# Patient Record
Sex: Male | Born: 2000 | Race: White | Hispanic: No | Marital: Single | Smoking: Never smoker
Health system: Southern US, Community
[De-identification: ages and names within clinical notes are randomized; demographics above are authoritative.]

## PROBLEM LIST (undated history)

## (undated) DIAGNOSIS — F32A Depression, unspecified: Secondary | ICD-10-CM

## (undated) HISTORY — PX: WRIST SURGERY: SHX841

---
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
## 2021-05-11 ENCOUNTER — Ambulatory Visit: Payer: 59 | Admitting: Sports Medicine

## 2021-05-11 VITALS — Ht 71.0 in | Wt 143.0 lb

## 2021-05-11 DIAGNOSIS — T148XXA Other injury of unspecified body region, initial encounter: Secondary | ICD-10-CM | POA: Diagnosis not present

## 2021-05-11 NOTE — Progress Notes (Signed)
   Subjective:    Patient ID: Christopher Rodgers, male    DOB: 2001/07/09, 20 y.o.   MRN: [id number]  HPI chief complaint: Right leg pain  Christopher Rodgers presents today complaining of right leg pain.  He initially injured this leg in April 2021.  He suffered a right quadriceps muscle tear and hematoma after kicking a soccer ball.  Over time, his swelling did resolve.  However, he endorses diffuse discomfort throughout the proximal and mid quad when initiating exercise.  He describes a discomfort initially at the start of a run that eventually subsides.  The other day he was skateboarding and noticed a little more discomfort than usual.  No recent trauma.  He does not endorse weakness.  No pain with activities of daily living.  He is here today with his mom.  Interim medical history reviewed Medications reviewed Allergies reviewed    Review of Systems As above    Objective:   Physical Exam  Well-developed, well-nourished.  No acute distress  Right leg: There is an obvious and palpable defect in the midportion of the right quadriceps muscle consistent with his previous quadriceps tear.  Full hip and knee range of motion.  Good strength.  No tenderness to palpation.  No soft tissue swelling.  Neurovascularly intact distally.      Assessment & Plan:   Chronic right quadriceps muscle tear  I explained to Mahesh that the defect in his quadriceps muscle will remain.  Other than some occasional soreness, it does not seem to interfere with his level of activity.  I explained to him and his mother that there is no good surgical solution for this and my advice would be to wear a thigh compression sleeve when exercising.  They understand.  Danzel will follow-up with me as needed.  This note was dictated using Dragon naturally speaking software and may contain errors in syntax, spelling, or content which have not been identified prior to signing this note.

## 2023-08-06 ENCOUNTER — Emergency Department (HOSPITAL_BASED_OUTPATIENT_CLINIC_OR_DEPARTMENT_OTHER)
Admission: EM | Admit: 2023-08-06 | Discharge: 2023-08-06 | Discharge status: Against Medical Advice | Payer: 59 | Attending: Emergency Medicine | Admitting: Emergency Medicine

## 2023-08-06 ENCOUNTER — Encounter (HOSPITAL_BASED_OUTPATIENT_CLINIC_OR_DEPARTMENT_OTHER): Payer: Self-pay

## 2023-08-06 ENCOUNTER — Other Ambulatory Visit: Payer: Self-pay

## 2023-08-06 DIAGNOSIS — R1031 Right lower quadrant pain: Secondary | ICD-10-CM | POA: Insufficient documentation

## 2023-08-06 DIAGNOSIS — Z5321 Procedure and treatment not carried out due to patient leaving prior to being seen by health care provider: Secondary | ICD-10-CM | POA: Diagnosis not present

## 2023-08-06 LAB — COMPREHENSIVE METABOLIC PANEL
ALT: 15 U/L (ref 0–44)
AST: 18 U/L (ref 15–41)
Albumin: 4.5 g/dL (ref 3.5–5.0)
Alkaline Phosphatase: 52 U/L (ref 38–126)
Anion gap: 10 (ref 5–15)
BUN: 12 mg/dL (ref 6–20)
CO2: 22 mmol/L (ref 22–32)
Calcium: 9 mg/dL (ref 8.9–10.3)
Chloride: 105 mmol/L (ref 98–111)
Creatinine, Ser: 0.87 mg/dL (ref 0.61–1.24)
GFR, Estimated: >60 mL/min (ref >60)
Glucose, Bld: 131 mg/dL — ABNORMAL HIGH (ref 70–99)
Potassium: 3.8 mmol/L (ref 3.5–5.1)
Sodium: 137 mmol/L (ref 135–145)
Total Bilirubin: 0.7 mg/dL (ref 0.0–1.2)
Total Protein: 7.2 g/dL (ref 6.5–8.1)

## 2023-08-06 LAB — URINALYSIS, MICROSCOPIC (REFLEX)

## 2023-08-06 LAB — URINALYSIS, ROUTINE W REFLEX MICROSCOPIC
Bilirubin Urine: NEGATIVE
Glucose, UA: NEGATIVE mg/dL
Ketones, ur: NEGATIVE mg/dL
Leukocytes,Ua: NEGATIVE
Nitrite: NEGATIVE
Protein, ur: NEGATIVE mg/dL
Specific Gravity, Urine: 1.02 (ref 1.005–1.030)
pH: 8.5 — ABNORMAL HIGH (ref 5.0–8.0)

## 2023-08-06 LAB — CBC
HCT: 40.3 % (ref 39.0–52.0)
Hemoglobin: 14.3 g/dL (ref 13.0–17.0)
MCH: 29 pg (ref 26.0–34.0)
MCHC: 35.5 g/dL (ref 30.0–36.0)
MCV: 81.7 fL (ref 80.0–100.0)
Platelets: 209 10*3/uL (ref 150–400)
RBC: 4.93 MIL/uL (ref 4.22–5.81)
RDW: 12.7 % (ref 11.5–15.5)
WBC: 6.8 10*3/uL (ref 4.0–10.5)
nRBC: 0 % (ref 0.0–0.2)

## 2023-08-06 LAB — LIPASE, BLOOD: Lipase: 28 U/L (ref 11–51)

## 2023-08-06 NOTE — ED Triage Notes (Signed)
 Pt arrives with right sided ABD pain that started this evening suddenly. Pt reports that pain also moves into his flank rigt flank area . Pt describes pain as sharp. Pt denies fevers or n/v.

## 2023-10-05 ENCOUNTER — Other Ambulatory Visit: Payer: Self-pay

## 2023-10-05 ENCOUNTER — Encounter (HOSPITAL_COMMUNITY): Payer: Self-pay

## 2023-10-05 ENCOUNTER — Emergency Department (HOSPITAL_COMMUNITY)
Admission: EM | Admit: 2023-10-05 | Discharge: 2023-10-05 | Disposition: A | Discharge status: Home / Self Care | Attending: Emergency Medicine | Admitting: Emergency Medicine

## 2023-10-05 DIAGNOSIS — F32A Depression, unspecified: Secondary | ICD-10-CM | POA: Insufficient documentation

## 2023-10-05 HISTORY — DX: Depression, unspecified: F32.A

## 2023-10-05 LAB — ACETAMINOPHEN LEVEL: Acetaminophen (Tylenol), Serum: <10 ug/mL — ABNORMAL LOW (ref 10–30)

## 2023-10-05 LAB — CBC
HCT: 45.5 % (ref 39.0–52.0)
Hemoglobin: 15.4 g/dL (ref 13.0–17.0)
MCH: 28.4 pg (ref 26.0–34.0)
MCHC: 33.8 g/dL (ref 30.0–36.0)
MCV: 83.9 fL (ref 80.0–100.0)
Platelets: 250 10*3/uL (ref 150–400)
RBC: 5.42 MIL/uL (ref 4.22–5.81)
RDW: 12.6 % (ref 11.5–15.5)
WBC: 7.3 10*3/uL (ref 4.0–10.5)
nRBC: 0 % (ref 0.0–0.2)

## 2023-10-05 LAB — COMPREHENSIVE METABOLIC PANEL
ALT: 27 U/L (ref 0–44)
AST: 21 U/L (ref 15–41)
Albumin: 3.8 g/dL (ref 3.5–5.0)
Alkaline Phosphatase: 49 U/L (ref 38–126)
Anion gap: 11 (ref 5–15)
BUN: 11 mg/dL (ref 6–20)
CO2: 25 mmol/L (ref 22–32)
Calcium: 9.5 mg/dL (ref 8.9–10.3)
Chloride: 103 mmol/L (ref 98–111)
Creatinine, Ser: 0.86 mg/dL (ref 0.61–1.24)
GFR, Estimated: >60 mL/min (ref >60)
Glucose, Bld: 104 mg/dL — ABNORMAL HIGH (ref 70–99)
Potassium: 4.3 mmol/L (ref 3.5–5.1)
Sodium: 139 mmol/L (ref 135–145)
Total Bilirubin: 0.7 mg/dL (ref 0.0–1.2)
Total Protein: 6.8 g/dL (ref 6.5–8.1)

## 2023-10-05 LAB — RAPID URINE DRUG SCREEN, HOSP PERFORMED
Amphetamines: NOT DETECTED
Barbiturates: NOT DETECTED
Benzodiazepines: NOT DETECTED
Cocaine: NOT DETECTED
Opiates: NOT DETECTED
Tetrahydrocannabinol: NOT DETECTED

## 2023-10-05 LAB — SALICYLATE LEVEL: Salicylate Lvl: <7 mg/dL — ABNORMAL LOW (ref 7.0–30.0)

## 2023-10-05 LAB — ETHANOL: Alcohol, Ethyl (B): <10 mg/dL (ref <10)

## 2023-10-05 NOTE — ED Provider Notes (Signed)
 [place] EMERGENCY DEPARTMENT AT [facility] Provider Note   CSN: [id number] Arrival date & time: 10/05/23  1328     History Chief Complaint  Patient presents with   Depression    HPI Christopher Rodgers is a 23 y.o. male presenting for psychiatric care.  He is a 23 year old male longstanding history depression.  Had to be switched off of his Lexapro which had been treating his symptoms well to BuSpar due to side effect profile. Unfortunately since he started the side effect profile.  He started having acute passive suicidal ideation.  No plan to hurt himself.  Told his mother he is having these feelings today. They recommend for immediate care and management.  He did have a 3-hour wait in the lobby and has not taken the medication this morning so his suicidal ideations have actually resolved during his weight today. He sitting or comfortably with his mother who provides collateral history and is in agreement that he is doing much better today. They are requesting discharge from the emergency room with a plan to go to a alternative location of treatment as they were placed in a hallway bed.   Patient's recorded medical, surgical, social, medication list and allergies were reviewed in the Snapshot window as part of the initial history.   Review of Systems   Review of Systems  Constitutional:  Negative for chills and fever.  HENT:  Negative for ear pain and sore throat.   Eyes:  Negative for pain and visual disturbance.  Respiratory:  Negative for cough and shortness of breath.   Cardiovascular:  Negative for chest pain and palpitations.  Gastrointestinal:  Negative for abdominal pain and vomiting.  Genitourinary:  Negative for dysuria and hematuria.  Musculoskeletal:  Negative for arthralgias and back pain.  Skin:  Negative for color change and rash.  Neurological:  Negative for seizures and syncope.  All other systems reviewed and are negative.   Physical Exam Updated  Vital Signs BP 126/82   Pulse 99   Temp 98.5 F (36.9 C)   Resp 18   Ht 5\' 11"  (1.803 m)   Wt 65.8 kg   SpO2 98%   BMI 20.22 kg/m  Physical Exam Vitals and nursing note reviewed.  Constitutional:      General: He is not in acute distress.    Appearance: He is well-developed.  HENT:     Head: Normocephalic and atraumatic.  Eyes:     Conjunctiva/sclera: Conjunctivae normal.  Cardiovascular:     Rate and Rhythm: Normal rate and regular rhythm.  Pulmonary:     Effort: Pulmonary effort is normal. No respiratory distress.  Abdominal:     General: Abdomen is flat. There is no distension.  Musculoskeletal:        General: No swelling or deformity.  Skin:    General: Skin is warm and dry.     Capillary Refill: Capillary refill takes less than 2 seconds.  Neurological:     Mental Status: He is alert and oriented to person, place, and time. Mental status is at baseline.      ED Course/ Medical Decision Making/ A&P    Procedures Procedures   Medications Ordered in ED Medications - No data to display  Medical Decision Making:   23 year old male presenting with a chief complaint of suicidal ideation.  He has no plan to hurt himself and is now recanted.  He is contracting verbally for safety with me. He has his mother at bedside  who is planning to take him directly to the behavioral health urgent care for more expedited psychiatric care. He denies any plan to hurt himself, has no history of self-harm attempts, no prior psychiatric admissions. Overall represents low risk for acute psychiatric emergency. Family will take him directly to the outside facility for further intervention. Disposition:  I have considered need for hospitalization, however, considering all of the above, I believe this patient is stable for discharge at this time.  Patient/family educated about specific return precautions for given chief complaint and symptoms.  Patient/family educated about follow-up  with PCP.     Patient/family expressed understanding of return precautions and need for follow-up. Patient spoken to regarding all imaging and laboratory results and appropriate follow up for these results. All education provided in verbal form with additional information in written form. Time was allowed for answering of patient questions. Patient discharged.    Emergency Department Medication Summary:   Medications - No data to display      Clinical Impression:  1. Depression, unspecified depression type      Discharge   Final Clinical Impression(s) / ED Diagnoses Final diagnoses:  Depression, unspecified depression type    Rx / DC Orders ED Discharge Orders     None         Glyn Ade, MD 10/05/23 1614

## 2023-10-05 NOTE — Discharge Instructions (Signed)
 Behavioral Health Resources in the [facility]  Intensive Outpatient Programs: [facility]      [address] [place], [place] [phone] Both a day and evening program       [facility] Outpatient     [address]        [place], [place] [address] [phone]         ADS: [facility] [address] [place] [phone]  [facility] Mental [facility] ACCESS LINE: [phone] or [phone] [address] [place], [place] [address] [email or web address]  Mobile Crisis Teams:                                        [facility]         Mobile Crisis Care Unit [phone]             [facility] [address] [place]ette Otto [phone]                                         [facility] [address] [place] [place] [phone]  Self-Help/Support Groups: [facility] [employer] of support groups [phone] (call for more info)  Narcotics Anonymous (NA) [facility] [address] [place] - 2 meetings at this location  Residential Treatment Programs:  [facility]      [address]        [place]       [phone]         [facility] [address] [place] [address] [place], [place]  [address] [phone]  [facility]  [address] [place], [place] [address] [phone] Admissions: 8am-3pm M-F  [facility]     [address]        [place], [place] [address]       [phone]         [facility] [address] [place], [place] [phone]  [facility] [address] [place], [place] [phone]  [facility] - Intensive Outpatient      [address]     [place], [place]       [phone]         [facility] ([facility]        [address] [place], [place] [phone] or [phone]  [facility] ([facility])  [address] [place], [place] [phone]  [facility] [address]                                               [address] [place] [phone]  [facility] Resources: [facility]478 [phone]               General Therapy                                                Angie Fava, PhD  [address] [place], [place] [address]         [phone]   Insurance  [facility]   [address] [place], [place] [address] [phone]  [facility] [address] [place], [place] [address] [phone] Insurance/Medicaid/sponsorship through [facility]                                              [address]                                        [place], [place] [address]    Therapy/tele-psych/case         [phone]          [facility] [address] [place], [place]  [address]  Adolescent/group home/case management [phone]                                           Creola Corn PhD       General therapy       Insurance   [phone]         Dr. Lolly Mustache Insurance [phone] M-F  [place] [facility] Medicaid, sponsorship [phone]

## 2023-10-05 NOTE — ED Triage Notes (Signed)
 Pt being treated for depression. Pt stopped lexapro approximately 2 weeks ago d/t side effects. Pt was then switched to buspar 15mg  BID. Pt started having dizziness and feeling like he was going to pass out. For past 2 days, pt has been more depressed, stating negative thoughts, verbalized wanting a car to hit him, states he would not commit suicide but has thoughts of not being here. Pt also diagnosed with mono 2 weeks ago and had prednisone dose pack for 12 days, last dose was 2 days ago. Pt was also given erythromycin x 5 days. Pt denies SI/HI/AVH at this time.

## 2023-11-04 ENCOUNTER — Encounter: Payer: Self-pay | Admitting: Behavioral Health

## 2023-11-04 ENCOUNTER — Ambulatory Visit: Admitting: Behavioral Health

## 2023-11-04 DIAGNOSIS — F411 Generalized anxiety disorder: Secondary | ICD-10-CM

## 2023-11-04 DIAGNOSIS — F330 Major depressive disorder, recurrent, mild: Secondary | ICD-10-CM

## 2023-11-04 NOTE — Progress Notes (Signed)
 [facility] MD/PA/NP Initial Note  11/04/2023 12:05 PM Christopher Rodgers  MRN:  [id number]  Chief Complaint:  Chief Complaint   Depression; Anxiety; Follow-up; Medication Refill; Patient Education     HPI:  "Ichael", 23 year old male presents to this office for initial visit and to establish care.  His mother Donnald Garre is present with his verbal consent.  Collateral information should be considered reliable.  York Spaniel says that he started to have some anxiety during the pandemic.  He says recently, "I overlooked things, I dwell on things and get fixated".  Says he had a few days over the last 2 weeks where he is felt depressed.  Says that he has experienced some passive SI over the last few months but says that he knows he would never act upon it.  Says that he loves his family too much and would never want to hurt them.  Says that he initially approached his PCP for the anxiety and depression and was started on a trial of Lexapro.  Says the medication was helpful but he did experience some blunting and sexual side effects that really bothered him.  Says that he then tried BuSpar but experienced increase in passive SI.  Says that he felt restlessness so he stopped the medication.  Felt that he was started on too high of a dose.  He would like to explore medication options and is also requesting GeneSight testing.  He reports anxiety today 5/10, and depression at 1/10.  He is sleeping 7 to 8 hours per night.  His PHQ 2 was negative.  His MDQ had 5 of the 14 criterion marked yes.  He does endorse frequent irritability, periods of talkativeness, racing thoughts, and increased energy.  He denies mania, no psychosis, no auditory or visual hallucinations.  Denies HI.  Patient states that he feels safe and verbally contracted for safety with this Clinical research associate.  Past psychiatric medication trials: Lexapro BuSpar     Visit Diagnosis:    ICD-10-CM   1. 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       Past Psychiatric History: Anxiety,  MDD  Past Medical History:  Past Medical History:  Diagnosis Date   Depression     Past Surgical History:  Procedure Laterality Date   WRIST SURGERY      Family Psychiatric History: see chart  Family History:  Family History  Problem Relation Age of Onset   Anxiety disorder Mother    Suicidality Maternal Aunt     Social History:  Social History   Socioeconomic History   Marital status: Single    Spouse name: Not on file   Number of children: Not on file   Years of education: 14   Highest education level: Some college, no degree  Occupational History   Occupation: St. Paediatric nurse  Tobacco Use   Smoking status: Never   Smokeless tobacco: Never  Vaping Use   Vaping status: Never Used  Substance and Sexual Activity   Alcohol use: Yes    Comment: light social   Drug use: No   Sexual activity: Yes  Other Topics Concern   Not on file  Social History Narrative   Lives at home with mom and dad. Enjoys guitar, sports free time.    Social Drivers of Corporate investment banker Strain: Low Risk  (09/25/2023)   Received from [facility]   Overall Financial Resource Strain (  CARDIA)    Difficulty of Paying Living Expenses: Not hard at all  Food Insecurity: No Food Insecurity (09/25/2023)   Received from [facility]   Hunger Vital Sign    Worried About Running Out of Food in the Last Year: Never true    Ran Out of Food in the Last Year: Never true  Transportation Needs: No Transportation Needs (09/25/2023)   Received from [facility] - Transportation    Lack of Transportation (Medical): No    Lack of Transportation (Non-Medical): No  Physical Activity: Insufficiently Active (09/17/2023)   Received from [facility]   Exercise Vital Sign    Days of Exercise per Week: 3 days    Minutes of Exercise per Session: 30 min  Stress: No Stress Concern Present (09/17/2023)    Received from [facility] - Occupational Stress Questionnaire    Feeling of Stress : Not at all  Social Connections: Socially Integrated (09/17/2023)   Received from [facility]   Social Network    How would you rate your social network (family, work, friends)?: Good participation with social networks    Allergies: No Known Allergies  Metabolic Disorder Labs: No results found for: "HGBA1C", "MPG" No results found for: "PROLACTIN" No results found for: "CHOL", "TRIG", "HDL", "CHOLHDL", "VLDL", "LDLCALC" No results found for: "TSH"  Therapeutic Level Labs: No results found for: "LITHIUM" No results found for: "VALPROATE" No results found for: "CBMZ"  Current Medications: Current Outpatient Medications  Medication Sig Dispense Refill   Doxycycline Hyclate 75 MG TABS      escitalopram (LEXAPRO) 10 MG tablet Take 10 mg by mouth daily.     No current facility-administered medications for this visit.    Medication Side Effects: none  Orders placed this visit:  No orders of the defined types were placed in this encounter.   Psychiatric Specialty Exam:  Review of Systems  Constitutional: Negative.   Allergic/Immunologic: Negative.   Psychiatric/Behavioral:  Positive for dysphoric mood. The patient is nervous/anxious.     Blood pressure 110/70, pulse 79, height 5\' 11"  (1.803 m), weight 144 lb (65.3 kg).Body mass index is 20.08 kg/m.  General Appearance: Casual, Neat, and Well Groomed  Eye Contact:  Good  Speech:  Clear and Coherent  Volume:  Normal  Mood:  Anxious  Affect:  Appropriate  Thought Process:  Coherent  Orientation:  Full (Time, Place, and Person)  Thought Content: Logical   Suicidal Thoughts:  No  Homicidal Thoughts:  No  Memory:  WNL  Judgement:  Good  Insight:  Good  Psychomotor Activity:  Normal  Concentration:  Concentration: Good  Recall:  Good  Fund of Knowledge: Good  Language: Good  Assets:   Desire for Improvement  ADL's:  Intact  Cognition: WNL  Prognosis:  Good   Screenings:  PHQ2-9    Flowsheet Row Office Visit from 11/04/2023 in [facility]  PHQ-2 Total Score 1      Flowsheet Row ED from 10/05/2023 in [facility] Department at [facility] ED from 08/06/2023 in [facility] Department at [facility]  C-SSRS RISK CATEGORY Low Risk No Risk       Receiving Psychotherapy: No   Treatment Plan/Recommendations:  Greater than 50% of  60 min face to face time with patient was spent on counseling and coordination of care. We discussed his history of anxiety and depression stemming back to 2020.  We  discussed his previous plan of care as well as past medication trials.  We reviewed possible medications and side effect profiles.  Discussed GeneSight testing with patient due to past side effects.  We agreed to complete GeneSight packet today and will follow-up with patient in 3 weeks to review and reassess.  We agreed today to: Will follow-up in 3 weeks to review GeneSight Will call or report worsening symptoms Provided emergency contact information Reviewed PDMP   Joan Flores, NP

## 2023-11-07 ENCOUNTER — Ambulatory Visit (HOSPITAL_COMMUNITY)
Admission: EM | Admit: 2023-11-07 | Discharge: 2023-11-08 | Disposition: A | Discharge status: Home / Self Care | Attending: Psychiatry | Admitting: Psychiatry

## 2023-11-07 ENCOUNTER — Telehealth: Payer: Self-pay | Admitting: Behavioral Health

## 2023-11-07 DIAGNOSIS — T43596A Underdosing of other antipsychotics and neuroleptics, initial encounter: Secondary | ICD-10-CM | POA: Insufficient documentation

## 2023-11-07 DIAGNOSIS — F930 Separation anxiety disorder of childhood: Secondary | ICD-10-CM | POA: Diagnosis not present

## 2023-11-07 DIAGNOSIS — R45851 Suicidal ideations: Secondary | ICD-10-CM | POA: Insufficient documentation

## 2023-11-07 DIAGNOSIS — F339 Major depressive disorder, recurrent, unspecified: Secondary | ICD-10-CM

## 2023-11-07 DIAGNOSIS — F338 Other recurrent depressive disorders: Secondary | ICD-10-CM | POA: Diagnosis not present

## 2023-11-07 DIAGNOSIS — F411 Generalized anxiety disorder: Secondary | ICD-10-CM | POA: Insufficient documentation

## 2023-11-07 DIAGNOSIS — Z91128 Patient's intentional underdosing of medication regimen for other reason: Secondary | ICD-10-CM | POA: Diagnosis not present

## 2023-11-07 DIAGNOSIS — T43226A Underdosing of selective serotonin reuptake inhibitors, initial encounter: Secondary | ICD-10-CM | POA: Diagnosis not present

## 2023-11-07 LAB — POCT URINE DRUG SCREEN - MANUAL ENTRY (I-SCREEN)
POC Amphetamine UR: NOT DETECTED
POC Buprenorphine (BUP): NOT DETECTED
POC Cocaine UR: NOT DETECTED
POC Marijuana UR: NOT DETECTED
POC Methadone UR: NOT DETECTED
POC Methamphetamine UR: NOT DETECTED
POC Morphine: NOT DETECTED
POC Oxazepam (BZO): NOT DETECTED
POC Oxycodone UR: NOT DETECTED
POC Secobarbital (BAR): NOT DETECTED

## 2023-11-07 MED ORDER — OLANZAPINE 5 MG PO TBDP: 5.0000 mg | ORAL_TABLET | Freq: Three times a day (TID) | ORAL | Status: DC | PRN

## 2023-11-07 MED ORDER — ACETAMINOPHEN 325 MG PO TABS: 650.0000 mg | ORAL_TABLET | Freq: Four times a day (QID) | ORAL | Status: DC | PRN

## 2023-11-07 MED ORDER — ALUM & MAG HYDROXIDE-SIMETH 200-200-20 MG/5ML PO SUSP: 30.0000 mL | ORAL | Status: DC | PRN

## 2023-11-07 MED ORDER — MAGNESIUM HYDROXIDE 400 MG/5ML PO SUSP: 30.0000 mL | Freq: Every day | ORAL | Status: DC | PRN

## 2023-11-07 MED ORDER — OLANZAPINE 10 MG IM SOLR: 5.0000 mg | Freq: Three times a day (TID) | INTRAMUSCULAR | Status: DC | PRN

## 2023-11-07 MED ORDER — OLANZAPINE 10 MG IM SOLR: 10.0000 mg | Freq: Three times a day (TID) | INTRAMUSCULAR | Status: DC | PRN

## 2023-11-07 NOTE — ED Provider Notes (Signed)
 [facility] Continuous Assessment Admission H&P  Date: 11/07/23 Patient Name: Christopher Rodgers MRN: [id number] Chief Complaint: increase anxiety with suicidal thoughts  Diagnoses:  Final diagnoses:  Suicidal ideation  Recurrent major depressive disorder, remission status unspecified (HCC)  GAD (generalized anxiety disorder)  Separation anxiety    HPI: Christopher Rodgers,  23 y/o male with a general anxiety, major depressive disorder.  Presented to [facility] accompanied by his mother.  Per the patient he has been having increased anxiety for the past 2 days and has been having suicidal thoughts.  Patient currently not seeing a psychiatrist or therapist, but patient stated he was started on Lexapro 10 mg by his PCP but he thought that it was too much and he was also started on BuSpar 30 mg.  Collateral patient's mother Christopher Rodgers), per the patient mother the patient told her today that he was feeling suicidal he wanted to kill himself and feeling that he wanted to die.  Patient mother stated that patient primary care physician had started patient on BuSpar but she thought that it was too much of a high dose and so she made an appointment with a psychiatrist because she would prefer to have somebody who specialize in depression to prescribe his medicine.  Mother also stated that patient did reach out to her psychiatrist and the psychiatrist nurse stated that he was unavailable today.  Writer discussed with both patient and mom that given patient suicidal thoughts he would need to be admitted for observation and reassess in the a.m.  Sometimes his mom became very emotional stating that they did not want to stay in that the patient wanted to sleep in his own bed.  Writer discussed with both patient and mom that because patient presented suicide thoughts and this has been going on from yesterday and also today we would need to admit the patient.   Face-to-face observation of patient, patient is alert and  oriented x 4, speech is clear, maintained minimal eye contact.  Throughout the whole interview patient is very dependent on his mom to talk for him.  Patient and mom does seem to have a codependent relationship.  Patient would sob and cry while talking and holding mom's hand while both of them would hug each other.  Patient endorsed suicidal thoughts but stated he did not have a plan.  Patient denies HI, AVH or paranoia.  Patient denies illicit drug use.  Patient endorsed drinking alcohol seldomly.  Patient denies access to guns.   Recommend observation unit      Total Time spent with patient: 1 hour  Musculoskeletal  Strength & Muscle Tone: within normal limits Gait & Station: normal Patient leans: N/A  Psychiatric Specialty Exam  Presentation General Appearance:  Casual  Eye Contact: Good  Speech: Clear and Coherent  Speech Volume: Normal  Handedness: Right   Mood and Affect  Mood: Anxious; Euthymic  Affect: Congruent   Thought Process  Thought Processes: Linear  Descriptions of Associations:Loose  Orientation:Full (Time, Place and Person)  Thought Content:Rumination; Scattered    Hallucinations:Hallucinations: None  Ideas of Reference:None  Suicidal Thoughts:Suicidal Thoughts: Yes, Passive SI Passive Intent and/or Plan: Without Plan  Homicidal Thoughts:Homicidal Thoughts: No   Sensorium  Memory: Immediate Fair  Judgment: Fair  Insight: Fair   Art therapist  Concentration: Fair  Attention Span: Good  Recall: Good  Fund of Knowledge: Good  Language: Good   Psychomotor Activity  Psychomotor Activity: Psychomotor Activity: Normal   Assets  Assets: Desire  for Improvement; Resilience   Sleep  Sleep: Sleep: Fair Number of Hours of Sleep: 6   Nutritional Assessment (For OBS and FBC admissions only) Has the patient had a weight loss or gain of 10 pounds or more in the last 3 months?: No Has the patient had a  decrease in food intake/or appetite?: No Does the patient have dental problems?: No Does the patient have eating habits or behaviors that may be indicators of an eating disorder including binging or inducing vomiting?: No Has the patient recently lost weight without trying?: 0 Has the patient been eating poorly because of a decreased appetite?: 0 Malnutrition Screening Tool Score: 0    Physical Exam HENT:     Head: Normocephalic.     Nose: Nose normal.  Eyes:     Pupils: Pupils are equal, round, and reactive to light.  Cardiovascular:     Rate and Rhythm: Normal rate.  Pulmonary:     Effort: Pulmonary effort is normal.  Musculoskeletal:        General: Normal range of motion.     Cervical back: Normal range of motion.  Neurological:     General: No focal deficit present.     Mental Status: He is alert.  Psychiatric:        Mood and Affect: Mood normal.        Behavior: Behavior normal.        Thought Content: Thought content normal.        Judgment: Judgment normal.    Review of Systems  Constitutional: Negative.   HENT: Negative.    Eyes: Negative.   Respiratory: Negative.    Cardiovascular: Negative.   Gastrointestinal: Negative.   Genitourinary: Negative.   Musculoskeletal: Negative.   Skin: Negative.   Neurological: Negative.   Psychiatric/Behavioral:  Positive for depression and suicidal ideas. The patient is nervous/anxious.     Blood pressure (!) 122/94, pulse 71, temperature (!) 97.5 F (36.4 C), temperature source Oral, resp. rate 16, SpO2 100%. There is no height or weight on file to calculate BMI.  Past Psychiatric History: GAD, MDD   Is the patient at risk to self? Yes  Has the patient been a risk to self in the past 6 months? Yes .    Has the patient been a risk to self within the distant past? No   Is the patient a risk to others? No   Has the patient been a risk to others in the past 6 months? No   Has the patient been a risk to others within the  distant past? No   Past Medical History: see chart   Family History: unknown   Social History: alcohol   Last Labs:  Admission on 10/05/2023, Discharged on 10/05/2023  Component Date Value Ref Range Status   Sodium 10/05/2023 139  135 - 145 mmol/L Final   Potassium 10/05/2023 4.3  3.5 - 5.1 mmol/L Final   Chloride 10/05/2023 103  98 - 111 mmol/L Final   CO2 10/05/2023 25  22 - 32 mmol/L Final   Glucose, Bld 10/05/2023 104 (H)  70 - 99 mg/dL Final   Glucose reference range applies only to samples taken after fasting for at least 8 hours.   BUN 10/05/2023 11  6 - 20 mg/dL Final   Creatinine, Ser 10/05/2023 0.86  0.61 - 1.24 mg/dL Final   Calcium 78/29/5621 9.5  8.9 - 10.3 mg/dL Final   Total Protein 30/86/5784 6.8  6.5 - 8.1 g/dL Final  Albumin 10/05/2023 3.8  3.5 - 5.0 g/dL Final   AST 16/05/9603 21  15 - 41 U/L Final   ALT 10/05/2023 27  0 - 44 U/L Final   Alkaline Phosphatase 10/05/2023 49  38 - 126 U/L Final   Total Bilirubin 10/05/2023 0.7  0.0 - 1.2 mg/dL Final   GFR, Estimated 10/05/2023 >60  >60 mL/min Final   Comment: (NOTE) Calculated using the CKD-EPI Creatinine Equation (2021)    Anion gap 10/05/2023 11  5 - 15 Final   Performed at [facility], [address], [place], [place] [address]   Alcohol, Ethyl (B) 10/05/2023 <10  <10 mg/dL Final   Comment: (NOTE) Lowest detectable limit for serum alcohol is 10 mg/dL.  For medical purposes only. Performed at [facility], [address], [place], [place] [address]    Salicylate Lvl 10/05/2023 <7.0 (L)  7.0 - 30.0 mg/dL Final   Performed at [facility], [address], [place], [place] [address]   Acetaminophen (Tylenol), Serum 10/05/2023 <10 (L)  10 - 30 ug/mL Final   Comment: (NOTE) Therapeutic concentrations vary significantly. A range of 10-30 ug/mL  may be an effective concentration for many patients. However, some  are best treated at concentrations outside of this range. Acetaminophen  concentrations >150 ug/mL at 4 hours after ingestion  and >50 ug/mL at 12 hours after ingestion are often associated with  toxic reactions.  Performed at [facility], [address], [place], [place] [address]    WBC 10/05/2023 7.3  4.0 - 10.5 K/uL Final   RBC 10/05/2023 5.42  4.22 - 5.81 MIL/uL Final   Hemoglobin 10/05/2023 15.4  13.0 - 17.0 g/dL Final   HCT 86/57/8469 45.5  39.0 - 52.0 % Final   MCV 10/05/2023 83.9  80.0 - 100.0 fL Final   MCH 10/05/2023 28.4  26.0 - 34.0 pg Final   MCHC 10/05/2023 33.8  30.0 - 36.0 g/dL Final   RDW 62/95/2841 12.6  11.5 - 15.5 % Final   Platelets 10/05/2023 250  150 - 400 K/uL Final   nRBC 10/05/2023 0.0  0.0 - 0.2 % Final   Performed at [facility], [address], [place], [place] [address]   Opiates 10/05/2023 NONE DETECTED  NONE DETECTED Final   Cocaine 10/05/2023 NONE DETECTED  NONE DETECTED Final   Benzodiazepines 10/05/2023 NONE DETECTED  NONE DETECTED Final   Amphetamines 10/05/2023 NONE DETECTED  NONE DETECTED Final   Tetrahydrocannabinol 10/05/2023 NONE DETECTED  NONE DETECTED Final   Barbiturates 10/05/2023 NONE DETECTED  NONE DETECTED Final   Comment: (NOTE) DRUG SCREEN FOR MEDICAL PURPOSES ONLY.  IF CONFIRMATION IS NEEDED FOR ANY PURPOSE, NOTIFY LAB WITHIN 5 DAYS.  LOWEST DETECTABLE LIMITS FOR URINE DRUG SCREEN Drug Class                     Cutoff (ng/mL) Amphetamine and metabolites    1000 Barbiturate and metabolites    200 Benzodiazepine                 200 Opiates and metabolites        300 Cocaine and metabolites        300 THC                            50 Performed at [facility], [address], [place], [place] [address]  Admission on 08/06/2023, Discharged on 08/06/2023  Component Date Value Ref Range Status   Lipase 08/06/2023 28  11 - 51 U/L Final   Performed at [facility], [address] [place], [place] [address]   Sodium 08/06/2023 137  135 - 145 mmol/L Final   Potassium  08/06/2023 3.8  3.5 - 5.1 mmol/L Final   Chloride 08/06/2023 105  98 - 111 mmol/L Final   CO2 08/06/2023 22  22 - 32 mmol/L Final   Glucose, Bld 08/06/2023 131 (H)  70 - 99 mg/dL Final   Glucose reference range applies only to samples taken after fasting for at least 8 hours.   BUN 08/06/2023 12  6 - 20 mg/dL Final   Creatinine, Ser 08/06/2023 0.87  0.61 - 1.24 mg/dL Final   Calcium 60/45/4098 9.0  8.9 - 10.3 mg/dL Final   Total Protein 11/91/4782 7.2  6.5 - 8.1 g/dL Final   Albumin 95/62/1308 4.5  3.5 - 5.0 g/dL Final   AST 65/78/4696 18  15 - 41 U/L Final   ALT 08/06/2023 15  0 - 44 U/L Final   Alkaline Phosphatase 08/06/2023 52  38 - 126 U/L Final   Total Bilirubin 08/06/2023 0.7  0.0 - 1.2 mg/dL Final   GFR, Estimated 08/06/2023 >60  >60 mL/min Final   Comment: (NOTE) Calculated using the CKD-EPI Creatinine Equation (2021)    Anion gap 08/06/2023 10  5 - 15 Final   Performed at [facility], [address] [place], [place] [address]   WBC 08/06/2023 6.8  4.0 - 10.5 K/uL Final   RBC 08/06/2023 4.93  4.22 - 5.81 MIL/uL Final   Hemoglobin 08/06/2023 14.3  13.0 - 17.0 g/dL Final   HCT 41/32/4401 40.3  39.0 - 52.0 % Final   MCV 08/06/2023 81.7  80.0 - 100.0 fL Final   MCH 08/06/2023 29.0  26.0 - 34.0 pg Final   MCHC 08/06/2023 35.5  30.0 - 36.0 g/dL Final   RDW 02/72/5366 12.7  11.5 - 15.5 % Final   Platelets 08/06/2023 209  150 - 400 K/uL Final   nRBC 08/06/2023 0.0  0.0 - 0.2 % Final   Performed at [facility], [address] [place], [place] [address]   Color, Urine 08/06/2023 YELLOW  YELLOW Final   APPearance 08/06/2023 CLOUDY (A)  CLEAR Final   Specific Gravity, Urine 08/06/2023 1.020  1.005 - 1.030 Final   pH 08/06/2023 8.5 (H)  5.0 - 8.0 Final   Glucose, UA 08/06/2023 NEGATIVE  NEGATIVE mg/dL Final   Hgb urine dipstick 08/06/2023 MODERATE (A)  NEGATIVE Final   Bilirubin Urine 08/06/2023 NEGATIVE  NEGATIVE Final   Ketones, ur 08/06/2023 NEGATIVE   NEGATIVE mg/dL Final   Protein, ur 74/25/9563 NEGATIVE  NEGATIVE mg/dL Final   Nitrite 87/56/4332 NEGATIVE  NEGATIVE Final   Leukocytes,Ua 08/06/2023 NEGATIVE  NEGATIVE Final   Performed at [facility], [address] [place], [place] [address]   RBC / HPF 08/06/2023 11-20  0 - 5 RBC/hpf Final   WBC, UA 08/06/2023 0-5  0 - 5 WBC/hpf Final   Bacteria, UA 08/06/2023 FEW (A)  NONE SEEN Final   Squamous Epithelial / HPF 08/06/2023 0-5  0 - 5 /HPF Final   Amorphous Crystal 08/06/2023 PRESENT   Final   Performed at [facility], [address] [place], [place] [address]    Allergies: Patient has no known  allergies.  Medications:  Facility Ordered Medications  Medication   acetaminophen (TYLENOL) tablet 650 mg   alum & mag hydroxide-simeth (MAALOX/MYLANTA) 200-200-20 MG/5ML suspension 30 mL   magnesium hydroxide (MILK OF MAGNESIA) suspension 30 mL   OLANZapine (ZYPREXA) injection 5 mg   OLANZapine (ZYPREXA) injection 10 mg   OLANZapine zydis (ZYPREXA) disintegrating tablet 5 mg   PTA Medications  Medication Sig   Doxycycline Hyclate 75 MG TABS    escitalopram (LEXAPRO) 10 MG tablet Take 10 mg by mouth daily.      Medical Decision Making  Observation unit    Recommendations  Based on my evaluation the patient does not appear to have an emergency medical condition.  Sindy Guadeloupe, NP 11/07/23  11:13 PM

## 2023-11-07 NOTE — Telephone Encounter (Signed)
 Mom has called to report patient having anxiety. He was seen this week for initial visit.  I called and tried to talk to pt, but mom is over talking in the background. Pt is rating his anxiety as 10/10 and his depression 8/10. States the anxiety is leading the depression. No new stressors. Minimal caffeine use. Asking for a medication for anxiety. Mom said he has had SSE from medications and wants to be sure that any medication would not cause similar issues.

## 2023-11-07 NOTE — Telephone Encounter (Signed)
 Mom called at 9:05a.  She states that pt had a panic attack yesterday.  He wants to try a medication that [name] mentioned.  Mom said [name] mentioned 3 different ones and she thinks it was the middle one and it started with a "V".  She said knows it wasn't Vyvanse because that's for ADHD. I said Venlafaxine, she said she thought so, but I guess you need to ask [name] for sure.  Pls send it to [facility].  Next appt 4/21

## 2023-11-07 NOTE — Progress Notes (Signed)
   11/07/23 1647  [facility] Triage Screening (Walk-ins at [facility] only)  What Is the Reason for Your Visit/Call Today? Pt presents to [facility] voluntarily accompanied by Mom. Pt states hes been experincng anxiety recently and Suicidal thoughts today and yesterday. "I know i wont act on it, but just having the thoughts is scary". "I feel like i have nobody but i do have people". Stopped taking anxiety meds a month ago and wonders if this is related. Pt states he is looking for medication to knock off the anxious thoughts. Pt denies SI, at this time, HI, AVH, Abuse, Alcohol/drug use.  How Long Has This Been Causing You Problems? <Week  Have You Recently Had Any Thoughts About Hurting Yourself? Yes  How long ago did you have thoughts about hurting yourself? Yesterday, no plan  Are You Planning to Commit Suicide/Harm Yourself At This time? No  Have you Recently Had Thoughts About Hurting Someone Karolee Ohs? No  Are You Planning To Harm Someone At This Time? No  Physical Abuse Denies  Verbal Abuse Denies  Sexual Abuse Denies  Exploitation of patient/patient's resources Denies  Self-Neglect Denies  Are you currently experiencing any auditory, visual or other hallucinations? No  Have You Used Any Alcohol or Drugs in the Past 24 Hours? No  Do you have any current medical co-morbidities that require immediate attention? No  What Do You Feel Would Help You the Most Today? Treatment for Depression or other mood problem;Medication(s)  If access to [facility] was not available, would you have sought care in the Emergency Department? No  Determination of Need Routine (7 days)  Options For Referral Outpatient Therapy;Medication Management

## 2023-11-07 NOTE — Telephone Encounter (Signed)
 I called mom again and told her that provider had an emergency and had to leave. I told him that another provider could not address the medication, but that a message had been sent to [place]. Mom/pt were at [facility].

## 2023-11-07 NOTE — ED Notes (Signed)
 Patient has been brought on unit, familiarized with unit and is now lying in the bed, will continue to monitor patient for safety.

## 2023-11-07 NOTE — Telephone Encounter (Deleted)
 Medication is NOT venlafaxine.

## 2023-11-07 NOTE — Telephone Encounter (Signed)
 Mom called back at 4:10 very concerned about Christopher Rodgers.  She is ready to take him to the [facility] care center.  She thought a medication was  going to be sent in.  I told her I was not sure when a medication would be sent in that is was being reviewed.  I told her if she do go to the [facility] unit and they prescribed something and then if we sent something in not to take both.  She should follow their instructions until they followed up with [name] for going forward.  If we send something in, please let her know.

## 2023-11-08 DIAGNOSIS — F411 Generalized anxiety disorder: Secondary | ICD-10-CM | POA: Diagnosis not present

## 2023-11-08 DIAGNOSIS — F930 Separation anxiety disorder of childhood: Secondary | ICD-10-CM | POA: Diagnosis not present

## 2023-11-08 DIAGNOSIS — F339 Major depressive disorder, recurrent, unspecified: Secondary | ICD-10-CM | POA: Diagnosis not present

## 2023-11-08 DIAGNOSIS — R45851 Suicidal ideations: Secondary | ICD-10-CM | POA: Diagnosis not present

## 2023-11-08 LAB — COMPREHENSIVE METABOLIC PANEL WITH GFR
ALT: 20 U/L (ref 0–44)
AST: 19 U/L (ref 15–41)
Albumin: 4.3 g/dL (ref 3.5–5.0)
Alkaline Phosphatase: 49 U/L (ref 38–126)
Anion gap: 11 (ref 5–15)
BUN: 14 mg/dL (ref 6–20)
CO2: 25 mmol/L (ref 22–32)
Calcium: 9.3 mg/dL (ref 8.9–10.3)
Chloride: 102 mmol/L (ref 98–111)
Creatinine, Ser: 0.86 mg/dL (ref 0.61–1.24)
GFR, Estimated: >60 mL/min (ref >60)
Glucose, Bld: 114 mg/dL — ABNORMAL HIGH (ref 70–99)
Potassium: 3.6 mmol/L (ref 3.5–5.1)
Sodium: 138 mmol/L (ref 135–145)
Total Bilirubin: 0.7 mg/dL (ref 0.0–1.2)
Total Protein: 7 g/dL (ref 6.5–8.1)

## 2023-11-08 LAB — CBC WITH DIFFERENTIAL/PLATELET
Abs Immature Granulocytes: 0.01 10*3/uL (ref 0.00–0.07)
Basophils Absolute: 0.1 10*3/uL (ref 0.0–0.1)
Basophils Relative: 1 %
Eosinophils Absolute: 0.9 10*3/uL — ABNORMAL HIGH (ref 0.0–0.5)
Eosinophils Relative: 11 %
HCT: 40.9 % (ref 39.0–52.0)
Hemoglobin: 14.6 g/dL (ref 13.0–17.0)
Immature Granulocytes: 0 %
Lymphocytes Relative: 24 %
Lymphs Abs: 1.8 10*3/uL (ref 0.7–4.0)
MCH: 29.4 pg (ref 26.0–34.0)
MCHC: 35.7 g/dL (ref 30.0–36.0)
MCV: 82.5 fL (ref 80.0–100.0)
Monocytes Absolute: 0.5 10*3/uL (ref 0.1–1.0)
Monocytes Relative: 7 %
Neutro Abs: 4.4 10*3/uL (ref 1.7–7.7)
Neutrophils Relative %: 57 %
Platelets: 174 10*3/uL (ref 150–400)
RBC: 4.96 MIL/uL (ref 4.22–5.81)
RDW: 13.5 % (ref 11.5–15.5)
WBC: 7.7 10*3/uL (ref 4.0–10.5)
nRBC: 0 % (ref 0.0–0.2)

## 2023-11-08 LAB — TSH: TSH: 1.027 u[IU]/mL (ref 0.350–4.500)

## 2023-11-08 LAB — ETHANOL: Alcohol, Ethyl (B): <10 mg/dL (ref <10)

## 2023-11-08 MED ORDER — HYDROXYZINE HCL 25 MG PO TABS: 25.0000 mg | ORAL_TABLET | Freq: Three times a day (TID) | ORAL | 0 refills | Status: DC | PRN

## 2023-11-08 MED ORDER — HYDROXYZINE HCL 25 MG PO TABS
25.0000 mg | ORAL_TABLET | Freq: Three times a day (TID) | ORAL | Status: DC | PRN
  Administered 2023-11-08: 25 mg via ORAL
  Filled 2023-11-08: qty 1

## 2023-11-08 MED ORDER — HYDROXYZINE HCL 25 MG PO TABS: 25.0000 mg | ORAL_TABLET | Freq: Three times a day (TID) | ORAL | 0 refills | Status: AC | PRN

## 2023-11-08 MED ORDER — VILAZODONE HCL 20 MG PO TABS: ORAL_TABLET | ORAL | 0 refills | Status: AC

## 2023-11-08 NOTE — ED Provider Notes (Signed)
 FBC/OBS ASAP Discharge Summary  Date and Time: 11/08/2023 8:55 AM  Name: Christopher Rodgers  MRN:  [id number]   Discharge Diagnoses:  Final diagnoses:  Suicidal ideation  Recurrent major depressive disorder, remission status unspecified (HCC)  GAD (generalized anxiety disorder)  Separation anxiety    Subjective: Christopher Rodgers 23 y.o., male patient presented to [facility] on 11/07/2023 as a voluntary walk in accompanied by his mother with complaints of increased anxiety and panic attacks leading to passive thoughts death without any plan to harm or kill self. He has PPH of depression and anxiety. Currently being followed by [facility].  Christopher Rodgers, is seen face to face by this provider, consulted with Dr. Loleta Chance; and chart reviewed on 11/08/23.  On evaluation Christopher Rodgers reports "I am feeling so much better. That anxiety medication that they gave me yesterday really helped. It is more so the anxiety and panic that leads to the thoughts of not wanting to be around because it feels so bad at that moment. I feel more anxious than I do depressed. The last time I thought about dying was yesterday morning. None today or since then though. I feel that the Buspar was making my anxiety worse so I am stopping that and will follow up with my doctor at [facility], I have an appointment sometime this week. I know I have a good support system with my mom and girlfriend. I also live with my mom. I have never actually tried to harm myself and I know wouldn't."  Pt is able to contract for safety. He does not have access to weapons or firearms in the home. He agrees to reach out mother or girlfriend for help if suicidal thoughts worsen or become active in nature or present to nearest ED/ [facility].   With patient's consent, writer called mother Christopher Rodgers [phone] for safety planning. She states that she spoke with pt this morning and feels that he is much better and his mood has improved along with  his anxiety decreased. She reports that pt does live with her and does not have access to weapons or firearms. She agrees to monitor him and encourage him to seek help if suicidal thoughts worsen or become active. She does  not have any safety concerns with pt being discharged home today. She is very grateful for care provided and will be able to come pick up pt this morning along with his girlfriend.   During evaluation Christopher Rodgers is lying in bed, in no acute distress.  He is alert & oriented x 4, calm, cooperative and attentive for this assessment.  His mood is euthymic with congruent affect.  He has normal speech, and behavior.  Objectively there is no evidence of psychosis/mania or delusional thinking. Pt does not appear to be responding to internal or external stimuli.  Patient is able to converse coherently, goal directed thoughts, no distractibility, or pre-occupation.  He also denies suicidal/self-harm/homicidal ideation, psychosis, and paranoia.  Patient answered assessment questions appropriately.    Stay Summary: 11/07/2023  Christopher Guadeloupe, NP                   Writer discussed with both patient and mom that given patient suicidal thoughts he would need to be admitted for observation and reassess in the a.m.  Sometimes his mom became very emotional stating that they did not want to stay in that the patient wanted to sleep in his own bed.  Writer discussed with both patient and mom that because patient presented suicide thoughts and this has been going on from yesterday and also today we would need to admit the patient. Face-to-face observation of patient, patient is alert and oriented x 4, speech is clear, maintained minimal eye contact.  Throughout the whole interview patient is very dependent on his mom to talk for him.  Patient and mom does seem to have a codependent relationship.  Patient would sob and cry while talking and holding mom's hand while both of them would hug each other.  Patient  endorsed suicidal thoughts but stated he did not have a plan.  Patient denies HI, AVH or paranoia.  Patient denies illicit drug use.  Patient endorsed drinking alcohol seldomly.  Patient denies access to guns. Recommend observation unit.  Total Time spent with patient: 30 minutes  Past Psychiatric History: MDD and GAD Past Medical History: Alopecia Family History: None reported Family Psychiatric History: None reported Social History: Pt is currently living with mother, he is employed and has a girlfriend that is supportive. He denies substance use, endorses occasional ETOH use.  Tobacco Cessation:  N/A, patient does not currently use tobacco products  Current Medications:  Current Facility-Administered Medications  Medication Dose Route Frequency Provider Last Rate Last Admin   acetaminophen (TYLENOL) tablet 650 mg  650 mg Oral Q6H PRN Christopher Guadeloupe, NP       alum & mag hydroxide-simeth (MAALOX/MYLANTA) 200-200-20 MG/5ML suspension 30 mL  30 mL Oral Q4H PRN Christopher Guadeloupe, NP       hydrOXYzine (ATARAX) tablet 25 mg  25 mg Oral TID PRN Onuoha, Chinwendu V, NP   25 mg at 11/08/23 0655   magnesium hydroxide (MILK OF MAGNESIA) suspension 30 mL  30 mL Oral Daily PRN Christopher Guadeloupe, NP       OLANZapine (ZYPREXA) injection 10 mg  10 mg Intramuscular TID PRN Christopher Guadeloupe, NP       OLANZapine (ZYPREXA) injection 5 mg  5 mg Intramuscular TID PRN Christopher Guadeloupe, NP       OLANZapine zydis (ZYPREXA) disintegrating tablet 5 mg  5 mg Oral TID PRN Christopher Guadeloupe, NP       Current Outpatient Medications  Medication Sig Dispense Refill   clobetasol (TEMOVATE) 0.05 % external solution Apply 1 Application topically daily.     ondansetron (ZOFRAN-ODT) 8 MG disintegrating tablet Take 8 mg by mouth 3 (three) times daily as needed for nausea or vomiting.     hydrOXYzine (ATARAX) 25 MG tablet Take 1 tablet (25 mg total) by mouth 3 (three) times daily as needed for anxiety. 21 tablet 0   minoxidil (LONITEN) 2.5 MG  tablet Take 2.5 mg by mouth daily.      PTA Medications:  Facility Ordered Medications  Medication   acetaminophen (TYLENOL) tablet 650 mg   alum & mag hydroxide-simeth (MAALOX/MYLANTA) 200-200-20 MG/5ML suspension 30 mL   magnesium hydroxide (MILK OF MAGNESIA) suspension 30 mL   OLANZapine (ZYPREXA) injection 5 mg   OLANZapine (ZYPREXA) injection 10 mg   OLANZapine zydis (ZYPREXA) disintegrating tablet 5 mg   hydrOXYzine (ATARAX) tablet 25 mg   PTA Medications  Medication Sig   ondansetron (ZOFRAN-ODT) 8 MG disintegrating tablet Take 8 mg by mouth 3 (three) times daily as needed for nausea or vomiting.   clobetasol (TEMOVATE) 0.05 % external solution Apply 1 Application topically daily.   minoxidil (LONITEN) 2.5 MG tablet Take 2.5 mg by mouth daily.   hydrOXYzine (ATARAX) 25 MG tablet  Take 1 tablet (25 mg total) by mouth 3 (three) times daily as needed for anxiety.       11/04/2023   11:45 AM 11/04/2023   11:44 AM  Depression screen PHQ 2/9  Decreased Interest 0 0  Down, Depressed, Hopeless 1 1  PHQ - 2 Score 1 1    Flowsheet Row ED from 11/07/2023 in [facility] ED from 10/05/2023 in [facility] Emergency Department at [facility] ED from 08/06/2023 in [facility] Emergency Department at [facility]  C-SSRS RISK CATEGORY Low Risk Low Risk No Risk       Musculoskeletal  Strength & Muscle Tone: within normal limits Gait & Station: normal Patient leans: N/A  Psychiatric Specialty Exam  Presentation  General Appearance:  Appropriate for Environment  Eye Contact: Good  Speech: Clear and Coherent  Speech Volume: Normal  Handedness: Right   Mood and Affect  Mood: Euthymic  Affect: Appropriate   Thought Process  Thought Processes: Coherent; Linear  Descriptions of Associations:Intact  Orientation:Full (Time, Place and Person)  Thought Content:WDL     Hallucinations:Hallucinations: None  Ideas of  Reference:None  Suicidal Thoughts:Suicidal Thoughts: No SI Passive Intent and/or Plan: Without Plan  Homicidal Thoughts:Homicidal Thoughts: No   Sensorium  Memory: Immediate Good; Recent Good  Judgment: Good  Insight: Good   Executive Functions  Concentration: Good  Attention Span: Good  Recall: Good  Fund of Knowledge: Good  Language: Good   Psychomotor Activity  Psychomotor Activity: Psychomotor Activity: Normal   Assets  Assets: Desire for Improvement; Housing; Physical Health; Social Support; Advertising copywriter; Health and safety inspector; Manufacturing systems engineer; Resilience   Sleep  Sleep: Sleep: Fair Number of Hours of Sleep: 6   Nutritional Assessment (For OBS and FBC admissions only) Has the patient had a weight loss or gain of 10 pounds or more in the last 3 months?: No Has the patient had a decrease in food intake/or appetite?: No Does the patient have dental problems?: No Does the patient have eating habits or behaviors that may be indicators of an eating disorder including binging or inducing vomiting?: No Has the patient recently lost weight without trying?: 0 Has the patient been eating poorly because of a decreased appetite?: 0 Malnutrition Screening Tool Score: 0    Physical Exam  Physical Exam Vitals and nursing note reviewed.  HENT:     Head: Normocephalic.     Nose: Nose normal.  Eyes:     Extraocular Movements: Extraocular movements intact.  Cardiovascular:     Rate and Rhythm: Normal rate.  Pulmonary:     Effort: Pulmonary effort is normal.  Musculoskeletal:        General: Normal range of motion.     Cervical back: Normal range of motion.  Neurological:     General: No focal deficit present.     Mental Status: He is alert and oriented to person, place, and time.    Review of Systems  Constitutional: Negative.   HENT: Negative.    Eyes: Negative.   Respiratory: Negative.    Cardiovascular: Negative.    Gastrointestinal: Negative.   Genitourinary: Negative.   Musculoskeletal: Negative.   Neurological: Negative.   Endo/Heme/Allergies: Negative.   Psychiatric/Behavioral:  Positive for depression. The patient is nervous/anxious.    Blood pressure (!) 143/87, pulse 62, temperature 98.3 F (36.8 C), temperature source Oral, resp. rate 17, SpO2 98%. There is no height or weight on file to calculate BMI.  Demographic Factors:  Male, Adolescent  or young adult, and Caucasian  Loss Factors: NA  Historical Factors: NA  Risk Reduction Factors:   Sense of responsibility to family, Employed, Living with another person, especially a relative, Positive social support, and Positive therapeutic relationship  Continued Clinical Symptoms:  Panic Attacks  Cognitive Features That Contribute To Risk:  None    Suicide Risk:  Minimal: No identifiable suicidal ideation.  Patients presenting with no risk factors but with morbid ruminations; may be classified as minimal risk based on the severity of the depressive symptoms  Plan Of Care/Follow-up recommendations:  Pt is being discharged home with mother. Safety planning was completed with both patient and mother (as charted above). Pt is able to contract for safety, denying suicidal ideations and mother does not have any immediate safety concerns with pt being discharged home today.  Safety Plan ANURAG SCARFO will reach out to Jamen Loiseau- his mother, call 911 or call mobile crisis, or go to nearest emergency room if condition worsens or if suicidal thoughts become active Patient will follow up with [facility] for outpatient psychiatric services (therapy/medication management).  The suicide prevention education provided includes the following: Suicide risk factors Suicide prevention and interventions National Suicide Hotline telephone number [facility] assessment telephone number [facility] Emergency  Assistance 911 [facility] and/or Residential Mobile Crisis Unit telephone number Request made of family/significant other to:   Remove weapons (e.g., guns, rifles, knives), all items previously/currently identified as safety concern.   Remove drugs/medications (over the counter, prescriptions, illicit drugs), all items previously/currently identified as a safety concern.   Pt has an outpatient follow up appointment with [facility] on 11/25/2023 to further discuss medication management. Pt is also encouraged to engage in therapy/ CBT to develop coping skills.   Pt is prescribed 1 week supply of Hydroxyzine 25 mg TID as needed for increased anxiety to bridge him through until his outpatient appointment. He is educated on the side effects and usage of this medication and verbalizes understanding.    While future psychiatric events cannot be accurately predicted, the patient does not currently require acute inpatient psychiatric care and does not currently meet [facility] involuntary commitment criteria. Patient was able to engage in safety planning including plan to return to [facility] or contact emergency services if he feels unable to maintain his own safety or the safety of others.     Disposition: Discharged home with mother and outpatient resources.   Howie Ill, NP 11/08/2023, 8:55 AM

## 2023-11-08 NOTE — ED Notes (Signed)
 Pt speaking w/ provider at this time

## 2023-11-08 NOTE — ED Notes (Signed)
 Pt sleeping at this time. Rise and fall of chest noted. Pt in NAD at this time. Will continue to monitor.

## 2023-11-08 NOTE — Telephone Encounter (Signed)
 The plan when he came in with mother was to do [place]. He wanted to wait until results came back in in three weeks to be reassessed and a medication discussed. Mother was present during that time. She needs to be reminded of that. Any medication that I right is not going to work overnight.

## 2023-11-08 NOTE — ED Notes (Addendum)
 Order has been placed for anxiety medication to help bring down patients bp. This nurse is waiting for Hydroxyzine to be verified by pharmacy

## 2023-11-08 NOTE — ED Notes (Signed)
 Pt is currently sleeping, no distress noted, environmental check complete, will continue to monitor patient for safety.

## 2023-11-08 NOTE — Telephone Encounter (Signed)
 Called mom. She is asking for [name] to prescribe a medication. I told her that she would need to see [name] to discuss [facility] results and she is asking for an appt ASAP. I'm waiting for [name] to let me know if it is ok to schedule an earlier appt.

## 2023-11-08 NOTE — Telephone Encounter (Signed)
 Scripts sent for Viibryd and hydroxyzine per Christopher Rodgers's request. Patient call with instructions on how to take and that he wanted to push FU out by 4 weeks. Have asked admin to call and change appt. Told him we would call him next week with therapist recommendations.

## 2023-11-08 NOTE — ED Notes (Signed)
 Patient discharged in no acute distress. He denies SI/HI and AVH at this time. Belongings returned from locker #13. Patient escorted to the front lobby for discharge home. Transported by his mother. Safety maintained.

## 2023-11-08 NOTE — Telephone Encounter (Signed)
 Christopher Rodgers prescribed Viibryd and hydroxyzine. He wants to schedule FU 4 weeks out to see how the medication is working. He needs therapy ASAP. Will talk with Christopher Rodgers about referring out for the needed therapy.

## 2023-11-08 NOTE — ED Notes (Signed)
 NP made aware via secure chat of patients elevated bp

## 2023-11-08 NOTE — Discharge Instructions (Addendum)
 Discharge recommendations:   Medications: Start taking Hydroxyzine 25 mg as needed for increased anxiety or panic, may take up to three times a day if needed. Patient is to take medications as prescribed. The patient is to contact a medical professional and/or outpatient provider to address any new side effects that develop. The patient should update outpatient providers of any new medications and/or medication changes.    Outpatient Follow up: Please follow up with your outpatient mental health appointment with [facility] on 11/25/2023 at 1:30 pm. Please follow up with your primary care provider for all medical related needs.    Therapy: We recommend that patient participate in individual therapy to address mental health concerns.   Safety:   The following safety precautions should be taken:   No sharp objects. This includes scissors, razors, scrapers, and putty knives.   Chemicals should be removed and locked up.   Medications should be removed and locked up.   Weapons should be removed and locked up. This includes firearms, knives and instruments that can be used to cause injury.   The patient should abstain from use of illicit substances/drugs and abuse of any medications.  If symptoms worsen or do not continue to improve or if the patient becomes actively suicidal or homicidal then it is recommended that the patient return to the closest hospital emergency department, the [facility], or call 911 for further evaluation and treatment. National Suicide Prevention Lifeline 1-800-SUICIDE or 910-871-2848.  About 988 988 offers 24/7 access to trained crisis counselors who can help people experiencing mental health-related distress. People can call or text 988 or chat 988lifeline.org for themselves or if they are worried about a loved one who may need crisis support.

## 2023-11-08 NOTE — Telephone Encounter (Signed)
 Christopher Rodgers wants to push FU appt out for 4 weeks to see his response to medication prescribed today. Please call to change his appt.

## 2023-11-08 NOTE — ED Notes (Signed)
 AVS given and reviewed with patient. Understanding voiced.

## 2023-11-13 ENCOUNTER — Ambulatory Visit: Admitting: Psychiatry

## 2023-11-20 ENCOUNTER — Encounter: Payer: Self-pay | Admitting: Behavioral Health

## 2023-11-20 ENCOUNTER — Ambulatory Visit: Admitting: Behavioral Health

## 2023-11-20 DIAGNOSIS — F418 Other specified anxiety disorders: Secondary | ICD-10-CM | POA: Diagnosis not present

## 2023-11-20 DIAGNOSIS — F330 Major depressive disorder, recurrent, mild: Secondary | ICD-10-CM

## 2023-11-20 DIAGNOSIS — F411 Generalized anxiety disorder: Secondary | ICD-10-CM | POA: Diagnosis not present

## 2023-11-20 NOTE — Progress Notes (Signed)
 [facility]  Patient ID: Christopher Rodgers,  MRN: [id number]  PCP: Aleen Ammons, MD  Date of Evaluation: 11/20/2023 Time spent:30 minutes  Chief Complaint:  Chief Complaint   Anxiety     HISTORY/CURRENT STATUS: HPI "Christopher Rodgers", 23 year old male presents to this office for follow up.  His mother Ardis Becton is present with his verbal consent.  Collateral information should be considered reliable. Says that after our last visit, he developed strong anxiety.  His previous doctor had told the family about the [facility] in [place].  Out of desperation they went to the facility and was evaluated.  Says that he was forced to stay overnight because he mentioned having suicidal ideation.  He said he did not want to kill self and did not have a plan but they kept him anyway.  His mother called [facility] and wanted to know if he could start medication that was recommended by [facility] the following day.  He was started on Viibryd 10 mg but said that he immediately vomited the medication back up, so he did not continue.  He says that since then he has been doing much better and has decided that he does not want to try medication at this time.  He also reports having side effects from hydroxyzine as well.  Says that he would like to pursue psychotherapy first.  Says that he will contact this office back if he decides to retry the Viibryd.  He reports anxiety today 3/10, and depression at 1/10.  He is sleeping 7 to 8 hours per night.  He denies mania, no psychosis, no auditory or visual hallucinations.  Denies any current SI or HI.  Patient states that he feels safe and verbally contracted for safety with this Clinical research associate.   Past psychiatric medication trials: Lexapro BuSpar      Individual Medical History/ Review of Systems: Changes? :No   Allergies: Patient has no known allergies.  Current Medications:  Current Outpatient Medications:    clobetasol (TEMOVATE) 0.05 % external solution, Apply 1  Application topically daily., Disp: , Rfl:    hydrOXYzine (ATARAX) 25 MG tablet, Take 1 tablet (25 mg total) by mouth 3 (three) times daily as needed for anxiety., Disp: 90 tablet, Rfl: 0   minoxidil (LONITEN) 2.5 MG tablet, Take 2.5 mg by mouth daily., Disp: , Rfl:    ondansetron (ZOFRAN-ODT) 8 MG disintegrating tablet, Take 8 mg by mouth 3 (three) times daily as needed for nausea or vomiting., Disp: , Rfl:    Vilazodone HCl 20 MG TABS, Take 1/2 tablet x7 days, then take 1 whole tablet. Must take with food., Disp: 30 tablet, Rfl: 0 Medication Side Effects: none  Family Medical/ Social History: Changes? No  MENTAL HEALTH EXAM:  There were no vitals taken for this visit.There is no height or weight on file to calculate BMI.  General Appearance: Casual, Neat, and Well Groomed  Eye Contact:  Good  Speech:  Clear and Coherent  Volume:  Increased  Mood:  Anxious  Affect:  Appropriate  Thought Process:  Coherent  Orientation:  Full (Time, Place, and Person)  Thought Content: Logical   Suicidal Thoughts:  No  Homicidal Thoughts:  No  Memory:  WNL  Judgement:  Good  Insight:  Good  Psychomotor Activity:  Normal  Concentration:  Concentration: Good  Recall:  Good  Fund of Knowledge: Good  Language: Good  Assets:  Desire for Improvement  ADL's:  Intact  Cognition: WNL  Prognosis:  Good  DIAGNOSES:    ICD-10-CM   1. Generalized anxiety disorder  F41.1     2. Mild episode of recurrent major depressive disorder (HCC)  F33.0     3. 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  We discussed his increased anxiety and visit to the [facility].  Talked about his experience and plan of care.  I was very candid with the patient and explained that I felt he had severe anxiety about taking medication.  Patient is still very fixated on sexual  performance and has extreme fear about being able to obtain erection or ejaculate with his girlfriend. It appear that this becomes obsessive at times.  Has a lot of anxiety about this occurring with medication.  I reviewed [facility] results with him today and explained that Viibryd was in the recommended column.  Taking the medication and having negative experience did not warrant stopping the medication.  Immediately vomiting the medication after taking was most likely due to his anxiety and apprehension about taking medication.  We discussed that it may not be appropriate to continue with medication therapy at this time and that psychotherapy may be more effective.  I referred him to [facility] in [place].  I recommended to the patient that he could reschedule another visit if he decided to try medication again.   We agreed today to:  Will call for follow-up if not making progress with psychotherapy Will call or report worsening symptoms Provided emergency contact information Reviewed PDMP    Lincoln Renshaw, NP

## 2023-11-21 ENCOUNTER — Encounter: Payer: Self-pay | Admitting: Behavioral Health

## 2023-11-25 ENCOUNTER — Ambulatory Visit: Admitting: Behavioral Health

## 2023-11-26 ENCOUNTER — Ambulatory Visit: Admitting: Mental Health

## 2023-11-26 DIAGNOSIS — F411 Generalized anxiety disorder: Secondary | ICD-10-CM

## 2023-11-26 DIAGNOSIS — F330 Major depressive disorder, recurrent, mild: Secondary | ICD-10-CM

## 2023-11-26 NOTE — Progress Notes (Signed)
 [facility] Initial Adult Exam  Name: Christopher Rodgers Date: 11/28/2023 MRN: [id number] DOB: [birth year] PCP: Aleen Ammons, MD  Time spent:  50 minutes   Reason for Visit /Presenting Problem: patient presents for assessment with his mother with consent.  Mother stated that he has been coping with anxiety primarily over the last few months. He stated he went to the ER in February due to having elevated anxiety and passive S.he stated that to [place] which was was grabbed by his primary care physician about a week prior, which he felt made his anxiety worse.  He stated that his mother was with him when he went to the hospital for assessment and, although he stated that he was not suicidal with plan or intent, he was admitted regardless.  Patient went on to share how this was upsetting to both he and his mother at that time, his being discharged the following day.  He stated that although this was a difficult time going inpatient, he feels that his anxiety has decreased somewhat since that time.  He wrote words that his anxiety increased in March, in part due to side effects from his prescribed Lexapro at that time; he started to feel like his girlfriend might leave him, which was not accurate but feels that his anxiety was affecting his thinking. He is in college [employer] for cyber security. He plans to stop and persue a job in [employer]. He got hired at a company about 2 weeks ago.  His support system consists of about 6 friends, his gf and mother, father, brother.   Recommended he return to therapy in 2 weeks and to continue care with Marita Sidle, NP.   Mental Status Exam:    Appearance:    Casual     Behavior:   Appropriate  Motor:   WNL  Speech/Language:    Clear and Coherent  Affect:   Full range   Mood:   Euthymic  Thought process:   Logical, linear, goal directed  Thought content:     WNL  Sensory/Perceptual disturbances:     none  Orientation:   x4  Attention:   Good  Concentration:    Good  Memory:   Intact  Fund of knowledge:    Consistent with age and development  Insight:     Good  Judgment:    Good  Impulse Control:   Good     Reported Symptoms:  anxiety, over thinking, recent depressed mood with passive SI (denies currently)   Risk Assessment: Danger to Self:  No denies SI denies any plan or intent to harm himself. Self-injurious Behavior: No Danger to Others: No Duty to Warn:no Physical Aggression / Violence:No  Access to Firearms a concern: No  Gang Involvement:No  Patient / guardian was educated about steps to take if suicide or homicide risk level increases between visits: yes While future psychiatric events cannot be accurately predicted, the patient does not currently require acute inpatient psychiatric care and does not currently meet [facility]  involuntary commitment criteria.   Substance Abuse History: Current substance abuse: No     Past Psychiatric History:   Outpatient Providers: Marita Sidle, NP [facility] History of Psych Hospitalization: No  Psychological Testing: none  Abuse History: Victim -none stated  Family History:  Raised by parents  - Maridee Shoemaker 1 sibling- brother -Annelle Barrs age 30  Family History  Problem Relation Age of Onset   Anxiety disorder Mother    Suicidality Maternal Aunt  Living situation: the patient lives with their family  Sexual Orientation:  Straight  Relationship Status: single  Name of spouse / other: gf- Olivia             If a parent, number of children / ages:none  Support Systems; significant other friends parents  Financial Stress:  No   Income/Employment/Disability:  full time Biomedical engineer: No   Educational History: Education: some college  Religion/Sprituality/World View:   Christian   Recreation/Hobbies: friends, guitar  Stressors: interpersonal  Strengths:  Supportive Relationships  Barriers:  none  Legal History: Pending legal issue /  charges: none History of legal issue / charges: none  Medical History/Surgical History: Past Medical History:  Diagnosis Date   Depression     Past Surgical History:  Procedure Laterality Date   WRIST SURGERY      Medications: Current Outpatient Medications  Medication Sig Dispense Refill   clobetasol (TEMOVATE) 0.05 % external solution Apply 1 Application topically daily.     hydrOXYzine  (ATARAX ) 25 MG tablet Take 1 tablet (25 mg total) by mouth 3 (three) times daily as needed for anxiety. 90 tablet 0   minoxidil (LONITEN) 2.5 MG tablet Take 2.5 mg by mouth daily.     ondansetron (ZOFRAN-ODT) 8 MG disintegrating tablet Take 8 mg by mouth 3 (three) times daily as needed for nausea or vomiting.     Vilazodone  HCl 20 MG TABS Take 1/2 tablet x7 days, then take 1 whole tablet. Must take with food. 30 tablet 0   No current facility-administered medications for this visit.    No Known Allergies  Diagnoses:    ICD-10-CM   1. Generalized anxiety disorder  F41.1     2. Mild episode of recurrent major depressive disorder (HCC)  F33.0       Plan of Care: TBD   Avram Lenis, [facility]

## 2023-12-04 ENCOUNTER — Other Ambulatory Visit: Payer: Self-pay | Admitting: Behavioral Health

## 2023-12-05 NOTE — Telephone Encounter (Signed)
 Please call to see if he is going to continue to follow with Polly Brink. Canceled FU.

## 2023-12-24 ENCOUNTER — Ambulatory Visit: Admitting: Mental Health

## 2024-01-09 ENCOUNTER — Ambulatory Visit: Admitting: Mental Health

## 2024-01-22 ENCOUNTER — Ambulatory Visit: Admitting: Mental Health

## 2024-03-06 ENCOUNTER — Other Ambulatory Visit: Payer: Self-pay | Admitting: Behavioral Health
